# Patient Record
Sex: Female | Born: 1995 | Hispanic: Yes | Marital: Single | Smoking: Current every day smoker
Health system: Southern US, Community
[De-identification: ages and names within clinical notes are randomized; demographics above are authoritative.]

---
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
## 2017-05-17 ENCOUNTER — Encounter (HOSPITAL_COMMUNITY): Payer: Self-pay | Admitting: Emergency Medicine

## 2017-05-17 DIAGNOSIS — N9089 Other specified noninflammatory disorders of vulva and perineum: Secondary | ICD-10-CM | POA: Insufficient documentation

## 2017-05-17 NOTE — ED Triage Notes (Signed)
Patient recently out of jail, had intercourse and pain with intercourse. Adds "bump" -difficult to describe. +discharge,

## 2017-05-18 ENCOUNTER — Emergency Department (HOSPITAL_COMMUNITY)
Admission: EM | Admit: 2017-05-18 | Discharge: 2017-05-18 | Disposition: A | Discharge status: Home / Self Care | Payer: Self-pay | Attending: Emergency Medicine | Admitting: Emergency Medicine

## 2017-05-18 DIAGNOSIS — R3989 Other symptoms and signs involving the genitourinary system: Secondary | ICD-10-CM

## 2017-05-18 DIAGNOSIS — N760 Acute vaginitis: Secondary | ICD-10-CM

## 2017-05-18 DIAGNOSIS — B9689 Other specified bacterial agents as the cause of diseases classified elsewhere: Secondary | ICD-10-CM

## 2017-05-18 LAB — URINALYSIS, ROUTINE W REFLEX MICROSCOPIC
BILIRUBIN URINE: NEGATIVE
GLUCOSE, UA: NEGATIVE mg/dL
HGB URINE DIPSTICK: NEGATIVE
KETONES UR: 5 mg/dL — AB
Leukocytes, UA: NEGATIVE
Nitrite: NEGATIVE
PROTEIN: NEGATIVE mg/dL
Specific Gravity, Urine: 1.023 (ref 1.005–1.030)
pH: 7 (ref 5.0–8.0)

## 2017-05-18 LAB — WET PREP, GENITAL
Sperm: NONE SEEN
Trich, Wet Prep: NONE SEEN
Yeast Wet Prep HPF POC: NONE SEEN

## 2017-05-18 LAB — GC/CHLAMYDIA PROBE AMP (~~LOC~~) NOT AT ARMC
Chlamydia: NEGATIVE
Neisseria Gonorrhea: POSITIVE — AB

## 2017-05-18 LAB — PREGNANCY, URINE: PREG TEST UR: NEGATIVE

## 2017-05-18 MED ORDER — METRONIDAZOLE 500 MG PO TABS: 500.0000 mg | ORAL_TABLET | Freq: Two times a day (BID) | ORAL | 0 refills | Status: AC

## 2017-05-18 NOTE — ED Notes (Signed)
Pt states that she has been having pain to her lower abd/vagina x 2 months. States that she started hurting 2 months ago before going to jail. Says it got better while in jail, but recently got out and having sex again, and now having increased pain. Also c/o pain to her "outter vagina" (about 6 o'clock) with a slight smell.

## 2017-05-18 NOTE — ED Provider Notes (Signed)
[facility]-EMERGENCY DEPT Provider Note   CSN: [id number] Arrival date & time: 05/17/17  1714     History   Chief Complaint Chief Complaint  Patient presents with  . Vaginal Discharge    patient concerned about bumps around vaginal area, pain with pressure, +discharge with foul order.    HPI Christine Edwards is a 21 y.o. female.  Patient presents for evaluation of vaginal pain x 2 months. She noticed painful intercourse the first time 2 months ago that originated at the vaginal opening. No discharge or irregular bleeding. She was not sexually active for 2 months while in jail and then resumed activity recently after release and noticed the pain was still there. She reports two "spots" on the labia that appear abnormal to her. No fever, nausea, abdominal pain.   The history is provided by the patient. No language interpreter was used.    History reviewed. No pertinent past medical history.  Patient Active Problem List   Diagnosis Date Noted  . Chlamydia 08/14/2016  . TOA (tubo-ovarian abscess) 08/12/2016  . Gonorrhea 08/12/2016    History reviewed. No pertinent surgical history.  OB History    Gravida Para Term Preterm AB Living   0 0 0 0 0 0   SAB TAB Ectopic Multiple Live Births   0 0 0 0 0       Home Medications    Prior to Admission medications   Medication Sig Start Date End Date Taking? Authorizing Provider  doxycycline (VIBRA-TABS) 100 MG tablet Take 1 tablet (100 mg total) by mouth every 12 (twelve) hours. Patient not taking: Reported on 05/18/2017 08/14/16   Tereso Newcomer, MD  ibuprofen (ADVIL,MOTRIN) 800 MG tablet Take 1 tablet (800 mg total) by mouth 3 (three) times daily with meals as needed for mild pain or cramping. Patient not taking: Reported on 05/18/2017 08/14/16   Tereso Newcomer, MD  oxyCODONE-acetaminophen (PERCOCET/ROXICET) 5-325 MG tablet Take 1 tablet by mouth every 4 (four) hours as needed for moderate pain. Patient not taking: Reported on  05/18/2017 08/14/16   Tereso Newcomer, MD    Family History History reviewed. No pertinent family history.  Social History Social History  Substance Use Topics  . Smoking status: Current Every Day Smoker    Packs/day: 0.50    Types: Cigarettes  . Smokeless tobacco: Never Used  . Alcohol use Yes     Allergies   Food; Azithromycin; and Erythromycin   Review of Systems Review of Systems  Constitutional: Negative for chills and fever.  Gastrointestinal: Negative.  Negative for abdominal pain and nausea.  Genitourinary: Positive for genital sores. Negative for dysuria, vaginal bleeding and vaginal discharge.  Musculoskeletal: Negative.  Negative for back pain and myalgias.  Neurological: Negative.      Physical Exam Updated Vital Signs BP 105/71 (BP Location: Left Arm)   Pulse 66   Temp 98.3 F (36.8 C) (Oral)   Resp 16   Ht  (1.676 m)   Wt 61.2 kg (135 lb)   LMP 05/03/2017   SpO2 98%   BMI 21.79 kg/m   Physical Exam  Constitutional: She is oriented to person, place, and time. She appears well-developed and well-nourished.  Neck: Normal range of motion.  Pulmonary/Chest: Effort normal.  Genitourinary:     Genitourinary Comments: Small, nonraised, gray vulvar patches visualized. No vesicles, blisters, erythema. No CMT, adnexal mass or tenderness.   Neurological: She is alert and oriented to person, place, and time.  Skin: Skin is  warm and dry.     ED Treatments / Results  Labs (all labs ordered are listed, but only abnormal results are displayed) Labs Reviewed  WET PREP, GENITAL - Abnormal; Notable for the following:       Result Value   Clue Cells Wet Prep HPF POC PRESENT (*)    WBC, Wet Prep HPF POC MODERATE (*)    All other components within normal limits  URINALYSIS, ROUTINE W REFLEX MICROSCOPIC - Abnormal; Notable for the following:    Ketones, ur 5 (*)    All other components within normal limits  PREGNANCY, URINE  GC/CHLAMYDIA PROBE AMP  ([place]) NOT AT [facility]    EKG  EKG Interpretation None       Radiology No results found.  Procedures Procedures (including critical care time)  Medications Ordered in ED Medications - No data to display   Initial Impression / Assessment and Plan / ED Course  I have reviewed the triage vital signs and the nursing notes.  Pertinent labs & imaging results that were available during my care of the patient were reviewed by me and considered in my medical decision making (see chart for details).     Patient here with concern for vaginal sores that are painful x 2 months. She is found to have BV, thought incidental to sores. Consider vaginal wart vs cancerous lesion. Do not suspect herpes, chancre or abscess. Will refer to GYN. RPR and HIV offered but patient declined.   Final Clinical Impressions(s) / ED Diagnoses   Final diagnoses:  None   1. Genital sore 2. BV  New Prescriptions New Prescriptions   No medications on file     Elpidio Anis, Cordelia Poche 05/18/17 0354    Elpidio Anis, PA-C 05/18/17 1610    Zadie Rhine, MD 05/18/17 [phone]

## 2017-05-18 NOTE — Discharge Instructions (Signed)
Take Flagyl for incidental finding of bacterial vaginosis. Do not drink alcohol while taking this medication. Follow up with gynecology for further evaluation of persistent vaginal sores.

## 2018-02-24 ENCOUNTER — Inpatient Hospital Stay
Admit: 2018-02-24 | Discharge: 2018-02-24 | Disposition: A | Discharge status: Home / Self Care | Payer: Self-pay | Attending: Pediatric Emergency Medicine

## 2018-02-24 ENCOUNTER — Emergency Department: Admit: 2018-02-24 | Payer: Self-pay

## 2018-02-24 ENCOUNTER — Emergency Department: Payer: Self-pay

## 2018-02-24 DIAGNOSIS — N83201 Unspecified ovarian cyst, right side: Secondary | ICD-10-CM

## 2018-02-24 LAB — CBC WITH MANUAL DIFF
ABS. BASOPHILS: 0 10*3/uL (ref 0.0–0.1)
ABS. EOSINOPHILS: 0.2 10*3/uL (ref 0.0–0.4)
ABS. IMM. GRANS.: 0 10*3/uL
ABS. LYMPHOCYTES: 5 10*3/uL — ABNORMAL HIGH (ref 0.8–3.5)
ABS. MONOCYTES: 0.4 10*3/uL (ref 0.0–1.0)
ABS. NEUTROPHILS: 6.3 10*3/uL (ref 1.8–8.0)
ABSOLUTE NRBC: 0 10*3/uL (ref 0.00–0.01)
BAND NEUTROPHILS: 0 % (ref 0–6)
BASOPHILS: 0 % (ref 0–1)
BLASTS: 0 %
Band Neutrophils: 0 % (ref 0–6)
Basophils %: 0 % (ref 0–1)
Basophils Absolute: 0 10*3/uL (ref 0.0–0.1)
Blasts: 0 %
EOSINOPHILS: 2 % (ref 0–7)
Eosinophils %: 2 % (ref 0–7)
Eosinophils Absolute: 0.2 10*3/uL (ref 0.0–0.4)
Granulocyte Absolute Count: 0 10*3/uL
HCT: 41.2 % (ref 35.0–47.0)
HGB: 13.8 g/dL (ref 11.5–16.0)
Hematocrit: 41.2 % (ref 35.0–47.0)
Hemoglobin: 13.8 g/dL (ref 11.5–16.0)
IMMATURE GRANULOCYTES: 0 %
Immature Granulocytes: 0 %
LYMPHOCYTES: 42 % (ref 12–49)
Lymphocytes %: 42 % (ref 12–49)
Lymphocytes Absolute: 5 10*3/uL — ABNORMAL HIGH (ref 0.8–3.5)
MCH: 31.2 PG (ref 26.0–34.0)
MCH: 31.2 PG (ref 26.0–34.0)
MCHC: 33.5 g/dL (ref 30.0–36.5)
MCHC: 33.5 g/dL (ref 30.0–36.5)
MCV: 93.2 FL (ref 80.0–99.0)
MCV: 93.2 FL (ref 80.0–99.0)
METAMYELOCYTES: 0 %
MONOCYTES: 3 % — ABNORMAL LOW (ref 5–13)
MPV: 10.9 FL (ref 8.9–12.9)
MPV: 10.9 FL (ref 8.9–12.9)
MYELOCYTES: 0 %
Metamyelocytes Relative: 0 %
Monocytes %: 3 % — ABNORMAL LOW (ref 5–13)
Monocytes Absolute: 0.4 10*3/uL (ref 0.0–1.0)
Myelocyte Percent: 0 %
NEUTROPHILS: 53 % (ref 32–75)
NRBC Absolute: 0 10*3/uL (ref 0.00–0.01)
NRBC: 0 PER 100 WBC
Neutrophils %: 53 % (ref 32–75)
Neutrophils Absolute: 6.3 10*3/uL (ref 1.8–8.0)
Nucleated RBCs: 0 PER 100 WBC
OTHER CELL: 0
Other Cell: 0
PLATELET: 211 10*3/uL (ref 150–400)
PROMYELOCYTES: 0 %
Platelets: 211 10*3/uL (ref 150–400)
Promyelocytes: 0 %
RBC: 4.42 M/uL (ref 3.80–5.20)
RBC: 4.42 M/uL (ref 3.80–5.20)
RDW: 12.7 % (ref 11.5–14.5)
RDW: 12.7 % (ref 11.5–14.5)
WBC: 11.9 10*3/uL — ABNORMAL HIGH (ref 3.6–11.0)
WBC: 11.9 10*3/uL — ABNORMAL HIGH (ref 3.6–11.0)

## 2018-02-24 LAB — METABOLIC PANEL, COMPREHENSIVE
A-G Ratio: 1.2 (ref 1.1–2.2)
ALT (SGPT): 18 U/L (ref 12–78)
AST (SGOT): 12 U/L — ABNORMAL LOW (ref 15–37)
Albumin: 3.8 g/dL (ref 3.5–5.0)
Alk. phosphatase: 72 U/L (ref 45–117)
Anion gap: 6 mmol/L (ref 5–15)
BUN/Creatinine ratio: 17 (ref 12–20)
BUN: 15 MG/DL (ref 6–20)
Bilirubin, total: 0.2 MG/DL (ref 0.2–1.0)
CO2: 25 mmol/L (ref 21–32)
Calcium: 9 MG/DL (ref 8.5–10.1)
Chloride: 107 mmol/L (ref 97–108)
Creatinine: 0.88 MG/DL (ref 0.55–1.02)
GFR est AA: >60 mL/min/{1.73_m2} (ref >60)
GFR est non-AA: >60 mL/min/{1.73_m2} (ref >60)
Globulin: 3.3 g/dL (ref 2.0–4.0)
Glucose: 95 mg/dL (ref 65–100)
Potassium: 3.7 mmol/L (ref 3.5–5.1)
Protein, total: 7.1 g/dL (ref 6.4–8.2)
Sodium: 138 mmol/L (ref 136–145)

## 2018-02-24 LAB — KOH, OTHER SOURCES
KOH: NONE SEEN
KOH: NONE SEEN

## 2018-02-24 LAB — URINALYSIS W/MICROSCOPIC
Bilirubin: NEGATIVE
Blood: NEGATIVE
Glucose: NEGATIVE mg/dL
Ketone: NEGATIVE mg/dL
Leukocyte Esterase: NEGATIVE
Nitrites: NEGATIVE
Protein: NEGATIVE mg/dL
Specific gravity: >1.03 — ABNORMAL HIGH (ref 1.003–1.030)
Urobilinogen: 0.2 EU/dL (ref 0.2–1.0)
pH (UA): 5.5 (ref 5.0–8.0)

## 2018-02-24 LAB — SAMPLES BEING HELD

## 2018-02-24 LAB — LIPASE
Lipase: 146 U/L (ref 73–393)
Lipase: 146 U/L (ref 73–393)

## 2018-02-24 LAB — URINE CULTURE HOLD SAMPLE

## 2018-02-24 LAB — WET PREP
Wet Prep, Trich: NONE SEEN
Wet prep: NONE SEEN

## 2018-02-24 LAB — BETA HCG, QT
Beta HCG, QT: <1 m[IU]/mL (ref 0–6)
hCG Quant: <1 m[IU]/mL (ref 0–6)

## 2018-02-24 LAB — COMPREHENSIVE METABOLIC PANEL
ALT: 18 U/L (ref 12–78)
AST: 12 U/L — ABNORMAL LOW (ref 15–37)
Albumin/Globulin Ratio: 1.2 (ref 1.1–2.2)
Albumin: 3.8 g/dL (ref 3.5–5.0)
Alkaline Phosphatase: 72 U/L (ref 45–117)
Anion Gap: 6 mmol/L (ref 5–15)
BUN: 15 MG/DL (ref 6–20)
Bun/Cre Ratio: 17 (ref 12–20)
CO2: 25 mmol/L (ref 21–32)
Calcium: 9 MG/DL (ref 8.5–10.1)
Chloride: 107 mmol/L (ref 97–108)
Creatinine: 0.88 MG/DL (ref 0.55–1.02)
EGFR IF NonAfrican American: >60 mL/min/{1.73_m2} (ref >60)
GFR African American: >60 mL/min/{1.73_m2} (ref >60)
Globulin: 3.3 g/dL (ref 2.0–4.0)
Glucose: 95 mg/dL (ref 65–100)
Potassium: 3.7 mmol/L (ref 3.5–5.1)
Sodium: 138 mmol/L (ref 136–145)
Total Bilirubin: 0.2 MG/DL (ref 0.2–1.0)
Total Protein: 7.1 g/dL (ref 6.4–8.2)

## 2018-02-24 LAB — URINALYSIS WITH MICROSCOPIC
Bilirubin, Urine: NEGATIVE
Blood, Urine: NEGATIVE
Glucose, Ur: NEGATIVE mg/dL
Ketones, Urine: NEGATIVE mg/dL
Leukocyte Esterase, Urine: NEGATIVE
Nitrite, Urine: NEGATIVE
Protein, UA: NEGATIVE mg/dL
Specific Gravity, UA: >1.03 NA — ABNORMAL HIGH (ref 1.003–1.030)
Urobilinogen, UA, POCT: 0.2 EU/dL (ref 0.2–1.0)
pH, UA: 5.5 (ref 5.0–8.0)

## 2018-02-24 MED ORDER — KETOROLAC TROMETHAMINE 30 MG/ML INJECTION
30 mg/mL (1 mL) | INTRAMUSCULAR | Status: AC
Start: 2018-02-24 — End: 2018-02-24
  Administered 2018-02-24: 17:00:00 via INTRAVENOUS

## 2018-02-24 MED ORDER — ONDANSETRON 4 MG TAB, RAPID DISSOLVE
4 mg | ORAL_TABLET | Freq: Three times a day (TID) | ORAL | 0 refills | Status: AC | PRN
Start: 2018-02-24 — End: ?

## 2018-02-24 MED ORDER — SODIUM CHLORIDE 0.9% BOLUS IV
0.9 % | Freq: Once | INTRAVENOUS | Status: AC
Start: 2018-02-24 — End: 2018-02-24
  Administered 2018-02-24: 17:00:00 via INTRAVENOUS

## 2018-02-24 MED ORDER — OXYCODONE-ACETAMINOPHEN 5 MG-325 MG TAB
5-325 mg | ORAL_TABLET | ORAL | 0 refills | Status: AC | PRN
Start: 2018-02-24 — End: 2018-02-27

## 2018-02-24 MED ORDER — DOXYCYCLINE HYCLATE 100 MG TAB
100 mg | ORAL | Status: AC
Start: 2018-02-24 — End: 2018-02-24
  Administered 2018-02-24: 20:00:00 via ORAL

## 2018-02-24 MED ORDER — METRONIDAZOLE 500 MG TAB
500 mg | ORAL_TABLET | Freq: Two times a day (BID) | ORAL | 0 refills | Status: AC
Start: 2018-02-24 — End: 2018-03-03

## 2018-02-24 MED ORDER — ONDANSETRON (PF) 4 MG/2 ML INJECTION
4 mg/2 mL | INTRAMUSCULAR | Status: AC
Start: 2018-02-24 — End: 2018-02-24
  Administered 2018-02-24: 17:00:00 via INTRAVENOUS

## 2018-02-24 MED ORDER — OXYCODONE-ACETAMINOPHEN 5 MG-325 MG TAB
5-325 mg | ORAL | Status: AC
Start: 2018-02-24 — End: 2018-02-24
  Administered 2018-02-24: 20:00:00 via ORAL

## 2018-02-24 MED ORDER — LIDOCAINE (PF) 10 MG/ML (1 %) IJ SOLN
10 mg/mL (1 %) | Freq: Once | INTRAMUSCULAR | Status: AC
Start: 2018-02-24 — End: 2018-02-24
  Administered 2018-02-24: 20:00:00 via INTRAMUSCULAR

## 2018-02-24 MED ORDER — DOXYCYCLINE HYCLATE 100 MG TAB
100 mg | ORAL_TABLET | Freq: Two times a day (BID) | ORAL | 0 refills | Status: AC
Start: 2018-02-24 — End: 2018-03-03

## 2018-02-24 MED FILL — OXYCODONE-ACETAMINOPHEN 5 MG-325 MG TAB: 5-325 mg | ORAL | Qty: 2

## 2018-02-24 MED FILL — SODIUM CHLORIDE 0.9 % IV: INTRAVENOUS | Qty: 1000

## 2018-02-24 MED FILL — KETOROLAC TROMETHAMINE 30 MG/ML INJECTION: 30 mg/mL (1 mL) | INTRAMUSCULAR | Qty: 1

## 2018-02-24 MED FILL — ONDANSETRON (PF) 4 MG/2 ML INJECTION: 4 mg/2 mL | INTRAMUSCULAR | Qty: 2

## 2018-02-24 MED FILL — DOXYCYCLINE HYCLATE 100 MG TAB: 100 mg | ORAL | Qty: 1

## 2018-02-24 MED FILL — CEFTRIAXONE 250 MG SOLUTION FOR INJECTION: 250 mg | INTRAMUSCULAR | Qty: 250

## 2018-02-24 NOTE — ED Notes (Signed)
Pt discharged home with parent/guardian. Pt acting age appropriately, respirations  Regular and unlabored, cap refill less than two seconds. Parent/guardian verbalized understanding of discharge paperwork and has no further questions at this time.

## 2018-02-24 NOTE — ED Provider Notes (Signed)
ED Provider Notes by Barry Dienes, NP at 02/24/18 1331                Author: Barry Dienes, NP  Service: --  Author Type: Nurse Practitioner       Filed: 02/24/18 2226  Date of Service: 02/24/18 1331  Status: Attested           Editor: Barry Dienes, NP (Nurse Practitioner)  Cosigner: Presley Raddle, MD at 02/27/18 2153          Attestation signed by Presley Raddle, MD at 02/27/18 2153          I was personally available for consultation in the emergency department.  I have reviewed the chart and agree with the documentation recorded by the [facility], including  the assessment, treatment plan, and disposition.   Presley Raddle, MD                                       22 y/o female with acute onset abdominal pain for the past 1 hour. She called EMS for transport here. She has never  had pain like this in the past. She has not taken any medications and no treatments tried. She denies any fever; no vomiting but c/o nausea today. She has had diarrhea for the past couple days, about 3-4 times a day, loose and non-bloody; drinking fluids  well but decreased appetite, hasn't eaten yet today. She ate [employer] yesterday. No vaginal pain or discharge. No dysuria, hematuria, flank pain or urgency. She is sexually active, no forms of birth control. Denies smoking, + alcohol use and drug use.       Pmh: none   Social: vaccines utd; non-smoker                History reviewed. No pertinent past medical history.      History reviewed. No pertinent surgical history.        History reviewed. No pertinent family history.        Social History          Socioeconomic History         ?  Marital status:  Not on file              Spouse name:  Not on file         ?  Number of children:  Not on file     ?  Years of education:  Not on file     ?  Highest education level:  Not on file       Occupational History        ?  Not on file       Social Needs         ?  Financial resource strain:  Not on file        ?   Food insecurity:              Worry:  Not on file         Inability:  Not on file        ?  Transportation needs:              Medical:  Not on file         Non-medical:  Not on file       Tobacco Use         ?  Smoking status:  Current Some Day Smoker     ?  Smokeless tobacco:  Current User       Substance and Sexual Activity         ?  Alcohol use:  Not on file     ?  Drug use:  Not on file     ?  Sexual activity:  Not on file       Lifestyle        ?  Physical activity:              Days per week:  Not on file         Minutes per session:  Not on file         ?  Stress:  Not on file       Relationships        ?  Social connections:              Talks on phone:  Not on file         Gets together:  Not on file         Attends religious service:  Not on file         Active member of club or organization:  Not on file         Attends meetings of clubs or organizations:  Not on file         Relationship status:  Not on file        ?  Intimate partner violence:              Fear of current or ex partner:  Not on file         Emotionally abused:  Not on file         Physically abused:  Not on file         Forced sexual activity:  Not on file        Other Topics  Concern        ?  Not on file       Social History Narrative        ?  Not on file              ALLERGIES: Erythromycin      Review of Systems    Constitutional: Negative.  Negative for activity change, appetite change and fever.    HENT: Negative.  Negative for sore throat.     Respiratory: Negative.  Negative for cough and wheezing.     Cardiovascular: Negative.  Negative for chest pain.    Gastrointestinal: Positive for abdominal pain and nausea . Negative for diarrhea and vomiting.    Genitourinary: Negative.     Musculoskeletal: Negative.  Negative for back pain and neck pain.    Skin: Negative.  Negative for rash.    Neurological: Negative.  Negative for headaches.    All other systems reviewed and are negative.           Vitals:           02/24/18 1310   02/24/18 1312         BP:    126/67     Pulse:    60     Resp:    22     Temp:    97.6 ??F (36.4 ??C)     SpO2:    99%         Weight:  58.5 kg (128  lb 15.5 oz)                  Physical Exam    Constitutional: She is oriented to person, place, and time. She appears well-developed and well-nourished. No distress.   Patient moaning and crying, curled up in ball on bed     HENT:   Right Ear: External ear normal.   Left Ear: External ear normal.    Mouth/Throat: Oropharynx is clear and moist. No oropharyngeal exudate.    Eyes: Pupils are equal, round, and reactive to light.    Neck: Normal range of motion. Neck supple.    Cardiovascular: Normal rate, regular rhythm and normal heart sounds.    Pulmonary/Chest: Effort normal and breath sounds normal. No respiratory distress. She has no wheezes.    Abdominal: Soft. Bowel sounds are normal. She exhibits no distension. There is tenderness in the  right upper quadrant, right lower quadrant,  periumbilical area and suprapubic area. There is no rebound, no guarding and no  CVA tenderness.   Genitourinary: Vagina normal and uterus normal. Cervix exhibits no motion tenderness and no discharge. Right  adnexum displays tenderness. No tenderness in the vagina. No vaginal discharge found.   Musculoskeletal: Normal range of motion. She exhibits no edema or tenderness.   Lymphadenopathy:     She has no cervical adenopathy.   Neurological: She is alert and oriented to person, place, and time.    Skin: Skin is warm and dry.    Nursing note and vitals reviewed.          MDM   Number of Diagnoses or Management Options   BV (bacterial vaginosis):    Hemorrhagic cyst of right ovary:    Diagnosis management comments: 22 y/o female with acute onset abdominal pain, generalized to the entire right side; no flank pain, hematuria or fever;    Broad DDX includes: ovarian torsion, cyst, ectopic pregnancy, pid, pyelonephritis,       Plan-- cbc, cmp, lipase, ultrasound pelvis and limited rlq, ivf,  urine hcg, ua          Amount and/or Complexity of Data Reviewed   Clinical lab tests: ordered and reviewed   Tests in the radiology section of CPT??: ordered and reviewed      Risk of Complications, Morbidity, and/or Mortality   Presenting problems: moderate  Diagnostic procedures: moderate  Management options: moderate     Patient Progress   Patient progress: improved             Procedures                 Recent Results (from the past 24 hour(s))     BETA HCG, QT          Collection Time: 02/24/18  1:20 PM         Result  Value  Ref Range            Beta HCG, QT  <1  0 - 6 MIU/ML       CBC WITH MANUAL DIFF          Collection Time: 02/24/18  1:20 PM         Result  Value  Ref Range            WBC  11.9 (H)  3.6 - 11.0 K/uL       RBC  4.42  3.80 - 5.20 M/uL       HGB  13.8  11.5 -  16.0 g/dL       HCT  41.2  35.0 - 47.0 %       MCV  93.2  80.0 - 99.0 FL       MCH  31.2  26.0 - 34.0 PG       MCHC  33.5  30.0 - 36.5 g/dL       RDW  12.7  11.5 - 14.5 %       PLATELET  211  150 - 400 K/uL       MPV  10.9  8.9 - 12.9 FL       NRBC  0.0  0 PER 100 WBC       ABSOLUTE NRBC  0.00  0.00 - 0.01 K/uL       NEUTROPHILS  53  32 - 75 %       BAND NEUTROPHILS  0  0 - 6 %       LYMPHOCYTES  42  12 - 49 %       MONOCYTES  3 (L)  5 - 13 %       EOSINOPHILS  2  0 - 7 %       BASOPHILS  0  0 - 1 %       METAMYELOCYTES  0  0 %       MYELOCYTES  0  0 %       PROMYELOCYTES  0  0 %       BLASTS  0  0 %       OTHER CELL  0  0         IMMATURE GRANULOCYTES  0  %       ABS. NEUTROPHILS  6.3  1.8 - 8.0 K/UL       ABS. LYMPHOCYTES  5.0 (H)  0.8 - 3.5 K/UL       ABS. MONOCYTES  0.4  0.0 - 1.0 K/UL       ABS. EOSINOPHILS  0.2  0.0 - 0.4 K/UL       ABS. BASOPHILS  0.0  0.0 - 0.1 K/UL       ABS. IMM. GRANS.  0.0  K/UL       DF  MANUAL          RBC COMMENTS  NORMOCYTIC, NORMOCHROMIC          WBC COMMENTS  ATYPICAL LYMPHOCYTES PRESENT          METABOLIC PANEL, COMPREHENSIVE          Collection Time: 02/24/18  1:20 PM         Result  Value  Ref Range             Sodium  138  136 - 145 mmol/L       Potassium  3.7  3.5 - 5.1 mmol/L       Chloride  107  97 - 108 mmol/L       CO2  25  21 - 32 mmol/L       Anion gap  6  5 - 15 mmol/L       Glucose  95  65 - 100 mg/dL       BUN  15  6 - 20 MG/DL       Creatinine  0.88  0.55 - 1.02 MG/DL       BUN/Creatinine ratio  17  12 - 20         GFR est  AA  >60  >60 ml/min/1.53m       GFR est non-AA  >60  >60 ml/min/1.719m      Calcium  9.0  8.5 - 10.1 MG/DL       Bilirubin, total  0.2  0.2 - 1.0 MG/DL       ALT (SGPT)  18  12 - 78 U/L       AST (SGOT)  12 (L)  15 - 37 U/L       Alk. phosphatase  72  45 - 117 U/L       Protein, total  7.1  6.4 - 8.2 g/dL       Albumin  3.8  3.5 - 5.0 g/dL       Globulin  3.3  2.0 - 4.0 g/dL       A-G Ratio  1.2  1.1 - 2.2         LIPASE          Collection Time: 02/24/18  1:20 PM         Result  Value  Ref Range            Lipase  146  73 - 393 U/L       SAMPLES BEING HELD          Collection Time: 02/24/18  1:20 PM         Result  Value  Ref Range            SAMPLES BEING HELD  1RED 1BC(SILV)         COMMENT                  Add-on orders for these samples will be processed based on acceptable specimen integrity and analyte stability, which may vary by analyte.       URINALYSIS W/MICROSCOPIC          Collection Time: 02/24/18  2:28 PM         Result  Value  Ref Range            Color  YELLOW/STRAW          Appearance  CLEAR  CLEAR         Specific gravity  >1.030 (H)  1.003 - 1.030       pH (UA)  5.5  5.0 - 8.0         Protein  NEGATIVE   NEG mg/dL       Glucose  NEGATIVE   NEG mg/dL       Ketone  NEGATIVE   NEG mg/dL       Bilirubin  NEGATIVE   NEG         Blood  NEGATIVE   NEG         Urobilinogen  0.2  0.2 - 1.0 EU/dL       Nitrites  NEGATIVE   NEG         Leukocyte Esterase  NEGATIVE   NEG         WBC  0-4  0 - 4 /hpf       RBC  0-5  0 - 5 /hpf       Epithelial cells  FEW  FEW /lpf       Bacteria  2+ (A)  NEG /hpf       Hyaline cast  0-2  0 - 5 /lpf       URINE CULTURE HOLD SAMPLE  Collection Time: 02/24/18  2:28 PM         Result  Value  Ref Range            Urine culture hold                  URINE ON HOLD IN MICROBIOLOGY DEPT FOR 3 DAYS. IF UNPRESERVED URINE IS SUBMITTED, IT CANNOT BE USED FOR ADDITIONAL TESTING AFTER 24 HRS, RECOLLECTION  WILL BE REQUIRED.       WET PREP          Collection Time: 02/24/18  3:44 PM         Result  Value  Ref Range            Clue cells  CLUE CELLS PRESENT               Wet prep  NO TRICHOMONAS SEEN              US Transvaginal      Result Date: 02/24/2018   EXAM:  ULTRASOUND FEMALE PELVIS TRANSABDOMINAL AND TRANSVAGINAL INDICATION: Abdominal and right lower quadrant pelvic pain. COMPARISON: None. TRANSABDOMINAL TECHNIQUE: Real-time sonography of the pelvis was performed using the urine filled bladder as  an acoustic window. Multiple static images of the uterus and ovaries were obtained. FINDINGS: UTERUS: The uterus is normal in size and echotexture and measures 8.2 x 4.4 x 5.2 cm. ENDOMETRIUM: The endometrial stripe measures 10 mm. RIGHT OVARY: The right  ovary measures 6.7 x 5.5 x 4.9 cm and contains a complex cystic 2.6 x 2.2 x 3.5 cm cyst. There is positive flow in the right ovary. LEFT OVARY: The left ovary is not visualized secondary to position CUL-DE-SAC: There is no mass or fluid or other abnormality  in the adnexa or cul-de-sac.       IMPRESSION:  Complex right ovarian cyst. Nonvisualization of the left ovary. Transvaginal sonography will be performed to better evaluate. TRANSVAGINAL TECHNIQUE: Transvaginal sonography was performed with multiple static images of the uterus and ovaries  obtained. FINDINGS: UTERUS: The uterus is normal. The uterus measures 7.9 x 3.7 x 5.3 cm.. ENDOMETRIUM: The endometrial stripe measures 7 mm.Marland Kitchen RIGHT OVARY: The right ovary is normal. The right ovary measures 8.7 x 4.8 x 4.7 cm and contains a complex cystic  3.7 x 2.3 x 2.4 cm cyst. cm.  There is normal color flow to the right ovary. LEFT OVARY: The left ovary is  normal. The left ovary measures 3.9 x 2.7 x 3.2 cm. There is normal color flow to the left ovary. CUL-DE-SAC: There is no fluid or mass or other  abnormality in the pelvic cul-de-sac. IMPRESSION: Complex right ovarian cyst, possibly hemorrhagic cyst. Normal uterus and left ovary.       [place] Pelv Non Obs      Result Date: 02/24/2018   EXAM:  ULTRASOUND FEMALE PELVIS TRANSABDOMINAL AND TRANSVAGINAL INDICATION: Abdominal and right lower quadrant pelvic pain. COMPARISON: None. TRANSABDOMINAL TECHNIQUE: Real-time sonography of the pelvis was performed using the urine filled bladder as  an acoustic window. Multiple static images of the uterus and ovaries were obtained. FINDINGS: UTERUS: The uterus is normal in size and echotexture and measures 8.2 x 4.4 x 5.2 cm. ENDOMETRIUM: The endometrial stripe measures 10 mm. RIGHT OVARY: The right  ovary measures 6.7 x 5.5 x 4.9 cm and contains a complex cystic 2.6 x 2.2 x 3.5 cm cyst. There is positive flow in the right ovary. LEFT OVARY: The left  ovary is not visualized secondary to position CUL-DE-SAC: There is no mass or fluid or other abnormality  in the adnexa or cul-de-sac.       IMPRESSION:  Complex right ovarian cyst. Nonvisualization of the left ovary. Transvaginal sonography will be performed to better evaluate. TRANSVAGINAL TECHNIQUE: Transvaginal sonography was performed with multiple static images of the uterus and ovaries  obtained. FINDINGS: UTERUS: The uterus is normal. The uterus measures 7.9 x 3.7 x 5.3 cm.. ENDOMETRIUM: The endometrial stripe measures 7 mm.Marland Kitchen RIGHT OVARY: The right ovary is normal. The right ovary measures 8.7 x 4.8 x 4.7 cm and contains a complex cystic  3.7 x 2.3 x 2.4 cm cyst. cm.  There is normal color flow to the right ovary. LEFT OVARY: The left ovary is normal. The left ovary measures 3.9 x 2.7 x 3.2 cm. There is normal color flow to the left ovary. CUL-DE-SAC: There is no fluid or mass or other  abnormality in the pelvic cul-de-sac.  IMPRESSION: Complex right ovarian cyst, possibly hemorrhagic cyst. Normal uterus and left ovary.                 All results reviewed with patient; She would like to be prophylactic ally treated for STDs. She was given rocephin here and due to erythromycin unknown allergy she will be given doxycyline for 7 days. She was also + for BV so will be given Rx for flagyl  for 7 days as well. She felt better after toradol but with increased pain after getting up and moving around so percocet given and will give prescription for home. She is visiting from [facility] and due to return this weekend, so I advised her she will need  to find a gynecologist where she is from for follow up and bring report of ultrasound with her. Return precautions discussed.       Patient's results have been reviewed with them. Patient and /or family have verbally conveyed understanding and agreement of the patient's signs, symptoms, diagnosis, treatment and  prognosis and additionally agree to follow up as recommended or return to the Emergency Department should their condition change prior to follow-up. Discharge instructions have also been provided to the patient with some educational information regarding  their diagnosis as well as a list of reasons why they would want to return to the ER prior to their follow-up appointment should their condition change.

## 2018-02-24 NOTE — ED Notes (Signed)
EDUCATION: Patient education given on obgyn follow up and the patient expresses understanding and acceptance of instructions. Ambrose MantleEliza F Petrohovich, RN 02/24/2018 4:46 PM

## 2018-02-24 NOTE — ED Notes (Signed)
EDUCATION: Patient education given on obgyn follow up and the patient expresses understanding and acceptance of instructions. Eliza F Petrohovich, RN 02/24/2018 4:46 PM

## 2018-02-24 NOTE — ED Provider Notes (Signed)
22 y/o female with acute onset abdominal pain for the past 1 hour. She called EMS for transport here. She has never had pain like this in the past. She has not taken any medications and no treatments tried. She denies any fever; no vomiting but c/o nausea today. She has had diarrhea for the past couple days, about 3-4 times a day, loose and non-bloody; drinking fluids well but decreased appetite, hasn't eaten yet today. She ate [employer] yesterday. No vaginal pain or discharge. No dysuria, hematuria, flank pain or urgency. She is sexually active, no forms of birth control. Denies smoking, + alcohol use and drug use.     Pmh: none  Social: vaccines utd; non-smoker           History reviewed. No pertinent past medical history.    History reviewed. No pertinent surgical history.      History reviewed. No pertinent family history.    Social History     Socioeconomic History   ??? Marital status: Not on file     Spouse name: Not on file   ??? Number of children: Not on file   ??? Years of education: Not on file   ??? Highest education level: Not on file   Occupational History   ??? Not on file   Social Needs   ??? Financial resource strain: Not on file   ??? Food insecurity:     Worry: Not on file     Inability: Not on file   ??? Transportation needs:     Medical: Not on file     Non-medical: Not on file   Tobacco Use   ??? Smoking status: Current Some Day Smoker   ??? Smokeless tobacco: Current User   Substance and Sexual Activity   ??? Alcohol use: Not on file   ??? Drug use: Not on file   ??? Sexual activity: Not on file   Lifestyle   ??? Physical activity:     Days per week: Not on file     Minutes per session: Not on file   ??? Stress: Not on file   Relationships   ??? Social connections:     Talks on phone: Not on file     Gets together: Not on file     Attends religious service: Not on file     Active member of club or organization: Not on file     Attends meetings of clubs or organizations: Not on file     Relationship status: Not on file    ??? Intimate partner violence:     Fear of current or ex partner: Not on file     Emotionally abused: Not on file     Physically abused: Not on file     Forced sexual activity: Not on file   Other Topics Concern   ??? Not on file   Social History Narrative   ??? Not on file         ALLERGIES: Erythromycin    Review of Systems   Constitutional: Negative.  Negative for activity change, appetite change and fever.   HENT: Negative.  Negative for sore throat.    Respiratory: Negative.  Negative for cough and wheezing.    Cardiovascular: Negative.  Negative for chest pain.   Gastrointestinal: Positive for abdominal pain and nausea. Negative for diarrhea and vomiting.   Genitourinary: Negative.    Musculoskeletal: Negative.  Negative for back pain and neck pain.   Skin: Negative.  Negative for rash.   Neurological: Negative.  Negative for headaches.   All other systems reviewed and are negative.      Vitals:    02/24/18 1310 02/24/18 1312   BP:  126/67   Pulse:  60   Resp:  22   Temp:  97.6 ??F (36.4 ??C)   SpO2:  99%   Weight: 58.5 kg (128 lb 15.5 oz)             Physical Exam   Constitutional: She is oriented to person, place, and time. She appears well-developed and well-nourished. No distress.   Patient moaning and crying, curled up in ball on bed   HENT:   Right Ear: External ear normal.   Left Ear: External ear normal.   Mouth/Throat: Oropharynx is clear and moist. No oropharyngeal exudate.   Eyes: Pupils are equal, round, and reactive to light.   Neck: Normal range of motion. Neck supple.   Cardiovascular: Normal rate, regular rhythm and normal heart sounds.   Pulmonary/Chest: Effort normal and breath sounds normal. No respiratory distress. She has no wheezes.   Abdominal: Soft. Bowel sounds are normal. She exhibits no distension. There is tenderness in the right upper quadrant, right lower quadrant, periumbilical area and suprapubic area. There is no rebound, no guarding and no CVA tenderness.    Genitourinary: Vagina normal and uterus normal. Cervix exhibits no motion tenderness and no discharge. Right adnexum displays tenderness. No tenderness in the vagina. No vaginal discharge found.   Musculoskeletal: Normal range of motion. She exhibits no edema or tenderness.   Lymphadenopathy:     She has no cervical adenopathy.   Neurological: She is alert and oriented to person, place, and time.   Skin: Skin is warm and dry.   Nursing note and vitals reviewed.       MDM  Number of Diagnoses or Management Options  BV (bacterial vaginosis):   Hemorrhagic cyst of right ovary:   Diagnosis management comments: 22 y/o female with acute onset abdominal pain, generalized to the entire right side; no flank pain, hematuria or fever;   Broad DDX includes: ovarian torsion, cyst, ectopic pregnancy, pid, pyelonephritis,     Plan-- cbc, cmp, lipase, ultrasound pelvis and limited rlq, ivf, urine hcg, ua       Amount and/or Complexity of Data Reviewed  Clinical lab tests: ordered and reviewed  Tests in the radiology section of CPT??: ordered and reviewed    Risk of Complications, Morbidity, and/or Mortality  Presenting problems: moderate  Diagnostic procedures: moderate  Management options: moderate    Patient Progress  Patient progress: improved         Procedures          Recent Results (from the past 24 hour(s))   BETA HCG, QT    Collection Time: 02/24/18  1:20 PM   Result Value Ref Range    Beta HCG, QT <1 0 - 6 MIU/ML   CBC WITH MANUAL DIFF    Collection Time: 02/24/18  1:20 PM   Result Value Ref Range    WBC 11.9 (H) 3.6 - 11.0 K/uL    RBC 4.42 3.80 - 5.20 M/uL    HGB 13.8 11.5 - 16.0 g/dL    HCT 41.2 35.0 - 47.0 %    MCV 93.2 80.0 - 99.0 FL    MCH 31.2 26.0 - 34.0 PG    MCHC 33.5 30.0 - 36.5 g/dL    RDW 12.7 11.5 - 14.5 %  PLATELET 211 150 - 400 K/uL    MPV 10.9 8.9 - 12.9 FL    NRBC 0.0 0 PER 100 WBC    ABSOLUTE NRBC 0.00 0.00 - 0.01 K/uL    NEUTROPHILS 53 32 - 75 %    BAND NEUTROPHILS 0 0 - 6 %     LYMPHOCYTES 42 12 - 49 %    MONOCYTES 3 (L) 5 - 13 %    EOSINOPHILS 2 0 - 7 %    BASOPHILS 0 0 - 1 %    METAMYELOCYTES 0 0 %    MYELOCYTES 0 0 %    PROMYELOCYTES 0 0 %    BLASTS 0 0 %    OTHER CELL 0 0      IMMATURE GRANULOCYTES 0 %    ABS. NEUTROPHILS 6.3 1.8 - 8.0 K/UL    ABS. LYMPHOCYTES 5.0 (H) 0.8 - 3.5 K/UL    ABS. MONOCYTES 0.4 0.0 - 1.0 K/UL    ABS. EOSINOPHILS 0.2 0.0 - 0.4 K/UL    ABS. BASOPHILS 0.0 0.0 - 0.1 K/UL    ABS. IMM. GRANS. 0.0 K/UL    DF MANUAL      RBC COMMENTS NORMOCYTIC, NORMOCHROMIC      WBC COMMENTS ATYPICAL LYMPHOCYTES PRESENT     METABOLIC PANEL, COMPREHENSIVE    Collection Time: 02/24/18  1:20 PM   Result Value Ref Range    Sodium 138 136 - 145 mmol/L    Potassium 3.7 3.5 - 5.1 mmol/L    Chloride 107 97 - 108 mmol/L    CO2 25 21 - 32 mmol/L    Anion gap 6 5 - 15 mmol/L    Glucose 95 65 - 100 mg/dL    BUN 15 6 - 20 MG/DL    Creatinine 0.88 0.55 - 1.02 MG/DL    BUN/Creatinine ratio 17 12 - 20      GFR est AA >60 >60 ml/min/1.105m    GFR est non-AA >60 >60 ml/min/1.764m   Calcium 9.0 8.5 - 10.1 MG/DL    Bilirubin, total 0.2 0.2 - 1.0 MG/DL    ALT (SGPT) 18 12 - 78 U/L    AST (SGOT) 12 (L) 15 - 37 U/L    Alk. phosphatase 72 45 - 117 U/L    Protein, total 7.1 6.4 - 8.2 g/dL    Albumin 3.8 3.5 - 5.0 g/dL    Globulin 3.3 2.0 - 4.0 g/dL    A-G Ratio 1.2 1.1 - 2.2     LIPASE    Collection Time: 02/24/18  1:20 PM   Result Value Ref Range    Lipase 146 73 - 393 U/L   SAMPLES BEING HELD    Collection Time: 02/24/18  1:20 PM   Result Value Ref Range    SAMPLES BEING HELD 1RED 1BC(SILV)     COMMENT        Add-on orders for these samples will be processed based on acceptable specimen integrity and analyte stability, which may vary by analyte.   URINALYSIS W/MICROSCOPIC    Collection Time: 02/24/18  2:28 PM   Result Value Ref Range    Color YELLOW/STRAW      Appearance CLEAR CLEAR      Specific gravity >1.030 (H) 1.003 - 1.030    pH (UA) 5.5 5.0 - 8.0      Protein NEGATIVE  NEG mg/dL     Glucose NEGATIVE  NEG mg/dL    Ketone NEGATIVE  NEG mg/dL  Bilirubin NEGATIVE  NEG      Blood NEGATIVE  NEG      Urobilinogen 0.2 0.2 - 1.0 EU/dL    Nitrites NEGATIVE  NEG      Leukocyte Esterase NEGATIVE  NEG      WBC 0-4 0 - 4 /hpf    RBC 0-5 0 - 5 /hpf    Epithelial cells FEW FEW /lpf    Bacteria 2+ (A) NEG /hpf    Hyaline cast 0-2 0 - 5 /lpf   URINE CULTURE HOLD SAMPLE    Collection Time: 02/24/18  2:28 PM   Result Value Ref Range    Urine culture hold        URINE ON HOLD IN MICROBIOLOGY DEPT FOR 3 DAYS. IF UNPRESERVED URINE IS SUBMITTED, IT CANNOT BE USED FOR ADDITIONAL TESTING AFTER 24 HRS, RECOLLECTION WILL BE REQUIRED.   WET PREP    Collection Time: 02/24/18  3:44 PM   Result Value Ref Range    Clue cells CLUE CELLS PRESENT      Wet prep NO TRICHOMONAS SEEN         US Transvaginal    Result Date: 02/24/2018  EXAM:  ULTRASOUND FEMALE PELVIS TRANSABDOMINAL AND TRANSVAGINAL INDICATION: Abdominal and right lower quadrant pelvic pain. COMPARISON: None. TRANSABDOMINAL TECHNIQUE: Real-time sonography of the pelvis was performed using the urine filled bladder as an acoustic window. Multiple static images of the uterus and ovaries were obtained. FINDINGS: UTERUS: The uterus is normal in size and echotexture and measures 8.2 x 4.4 x 5.2 cm. ENDOMETRIUM: The endometrial stripe measures 10 mm. RIGHT OVARY: The right ovary measures 6.7 x 5.5 x 4.9 cm and contains a complex cystic 2.6 x 2.2 x 3.5 cm cyst. There is positive flow in the right ovary. LEFT OVARY: The left ovary is not visualized secondary to position CUL-DE-SAC: There is no mass or fluid or other abnormality in the adnexa or cul-de-sac.     IMPRESSION:  Complex right ovarian cyst. Nonvisualization of the left ovary. Transvaginal sonography will be performed to better evaluate. TRANSVAGINAL TECHNIQUE: Transvaginal sonography was performed with multiple static images of the uterus and ovaries obtained. FINDINGS:  UTERUS: The uterus is normal. The uterus measures 7.9 x 3.7 x 5.3 cm.. ENDOMETRIUM: The endometrial stripe measures 7 mm.Marland Kitchen RIGHT OVARY: The right ovary is normal. The right ovary measures 8.7 x 4.8 x 4.7 cm and contains a complex cystic 3.7 x 2.3 x 2.4 cm cyst. cm.  There is normal color flow to the right ovary. LEFT OVARY: The left ovary is normal. The left ovary measures 3.9 x 2.7 x 3.2 cm. There is normal color flow to the left ovary. CUL-DE-SAC: There is no fluid or mass or other abnormality in the pelvic cul-de-sac. IMPRESSION: Complex right ovarian cyst, possibly hemorrhagic cyst. Normal uterus and left ovary.     [place] Pelv Non Obs    Result Date: 02/24/2018  EXAM:  ULTRASOUND FEMALE PELVIS TRANSABDOMINAL AND TRANSVAGINAL INDICATION: Abdominal and right lower quadrant pelvic pain. COMPARISON: None. TRANSABDOMINAL TECHNIQUE: Real-time sonography of the pelvis was performed using the urine filled bladder as an acoustic window. Multiple static images of the uterus and ovaries were obtained. FINDINGS: UTERUS: The uterus is normal in size and echotexture and measures 8.2 x 4.4 x 5.2 cm. ENDOMETRIUM: The endometrial stripe measures 10 mm. RIGHT OVARY: The right ovary measures 6.7 x 5.5 x 4.9 cm and contains a complex cystic 2.6 x 2.2 x 3.5 cm cyst. There is positive  flow in the right ovary. LEFT OVARY: The left ovary is not visualized secondary to position CUL-DE-SAC: There is no mass or fluid or other abnormality in the adnexa or cul-de-sac.     IMPRESSION:  Complex right ovarian cyst. Nonvisualization of the left ovary. Transvaginal sonography will be performed to better evaluate. TRANSVAGINAL TECHNIQUE: Transvaginal sonography was performed with multiple static images of the uterus and ovaries obtained. FINDINGS: UTERUS: The uterus is normal. The uterus measures 7.9 x 3.7 x 5.3 cm.. ENDOMETRIUM: The endometrial stripe measures 7 mm.Marland Kitchen RIGHT OVARY: The right  ovary is normal. The right ovary measures 8.7 x 4.8 x 4.7 cm and contains a complex cystic 3.7 x 2.3 x 2.4 cm cyst. cm.  There is normal color flow to the right ovary. LEFT OVARY: The left ovary is normal. The left ovary measures 3.9 x 2.7 x 3.2 cm. There is normal color flow to the left ovary. CUL-DE-SAC: There is no fluid or mass or other abnormality in the pelvic cul-de-sac. IMPRESSION: Complex right ovarian cyst, possibly hemorrhagic cyst. Normal uterus and left ovary.            All results reviewed with patient; She would like to be prophylactic ally treated for STDs. She was given rocephin here and due to erythromycin unknown allergy she will be given doxycyline for 7 days. She was also + for BV so will be given Rx for flagyl for 7 days as well. She felt better after toradol but with increased pain after getting up and moving around so percocet given and will give prescription for home. She is visiting from [facility] and due to return this weekend, so I advised her she will need to find a gynecologist where she is from for follow up and bring report of ultrasound with her. Return precautions discussed.     Patient's results have been reviewed with them. Patient and /or family have verbally conveyed understanding and agreement of the patient's signs, symptoms, diagnosis, treatment and prognosis and additionally agree to follow up as recommended or return to the Emergency Department should their condition change prior to follow-up. Discharge instructions have also been provided to the patient with some educational information regarding their diagnosis as well as a list of reasons why they would want to return to the ER prior to their follow-up appointment should their condition change.

## 2018-02-27 LAB — CHLAMYDIA/GC PCR
CHLAmydia Amplified: NEGATIVE
Chlamydia amplified: NEGATIVE
N gonorrhoeae Amp.: NEGATIVE
N. gonorrhea, amplified: NEGATIVE

## 2018-03-17 IMAGING — CT CT ABD-PELV W/ CM
2 of 5 series · 15 of 46 positions shown, 17 images · IV contrast (APPLIED)
Comparison: None.

CLINICAL DATA: Lower abdominal pain for 2 weeks.  Nausea.

EXAM:
CT ABDOMEN AND PELVIS WITH CONTRAST
TECHNIQUE: Multidetector CT imaging of the abdomen and pelvis was performed
using the standard protocol following bolus administration of
intravenous contrast.
CONTRAST:  100mL REXS1S-LGG IOPAMIDOL (REXS1S-LGG) INJECTION 61%

[Series 2: axial st · axial · 0.81mm/px · z∈[-516,-106]mm · 12 of 94 slices shown, 14 images]
[im 6/94  soft-tissue]
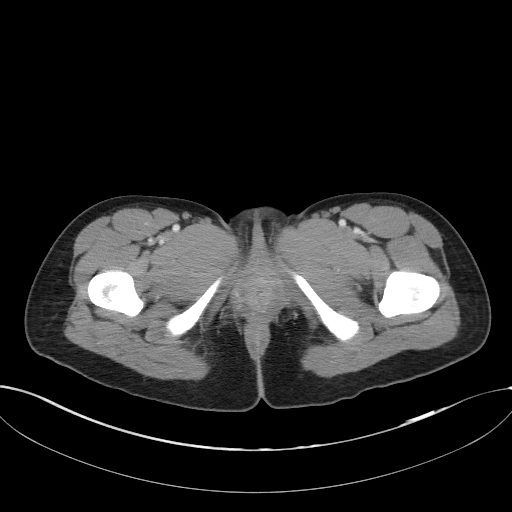
[im 6/94  bone]
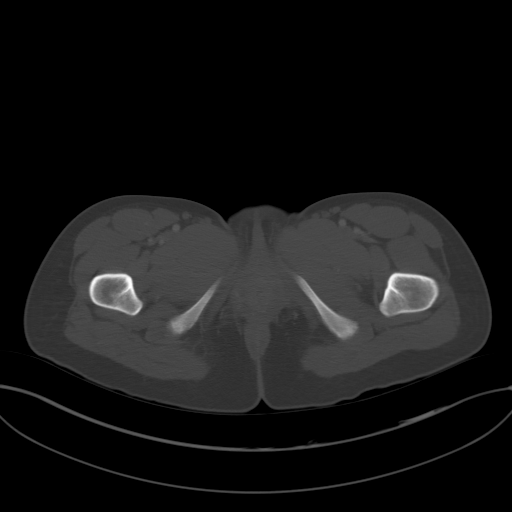
[im 17/94  soft-tissue]
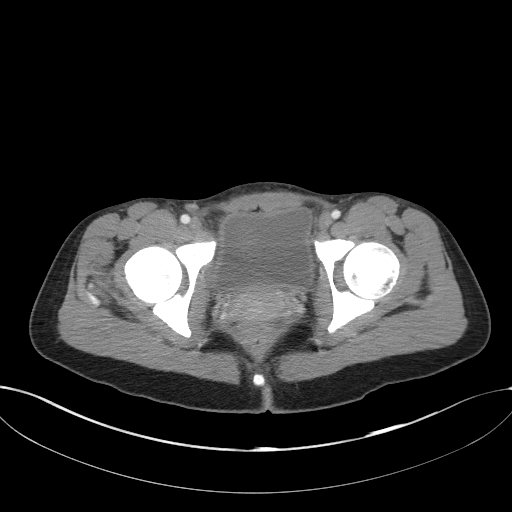
[im 22/94  soft-tissue]
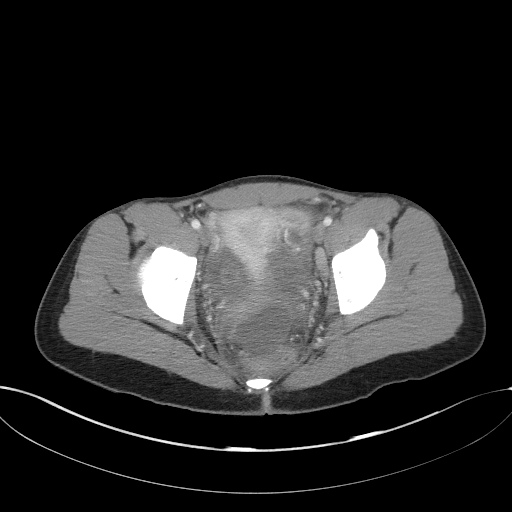
[im 28/94  soft-tissue]
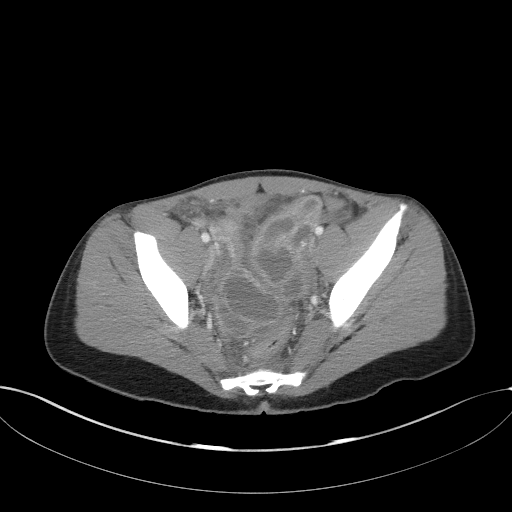
[im 39/94  soft-tissue]
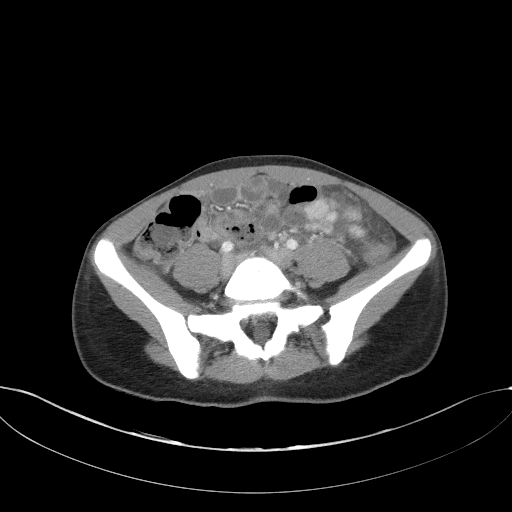
[im 44/94  soft-tissue]
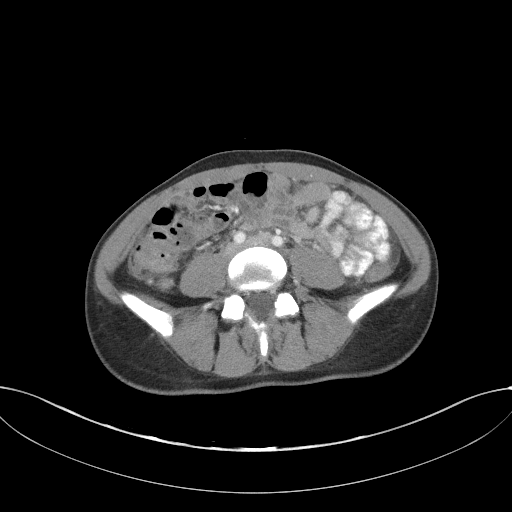
[im 50/94  soft-tissue]
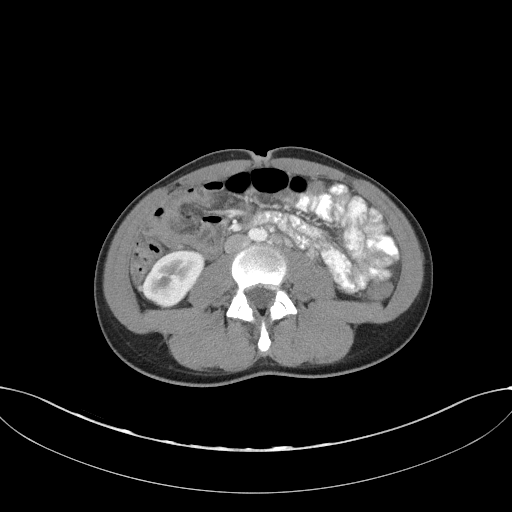
[im 61/94  soft-tissue]
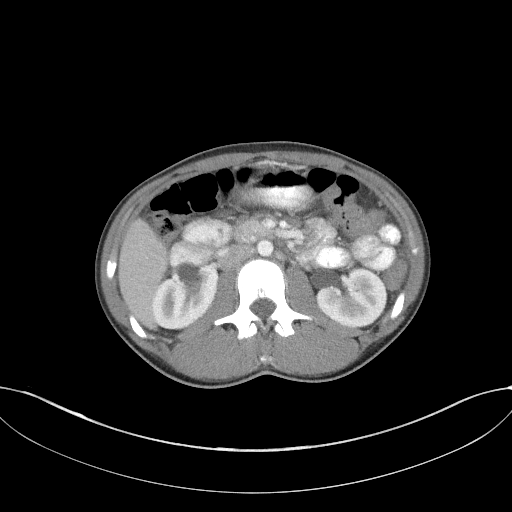
[im 66/94  soft-tissue]
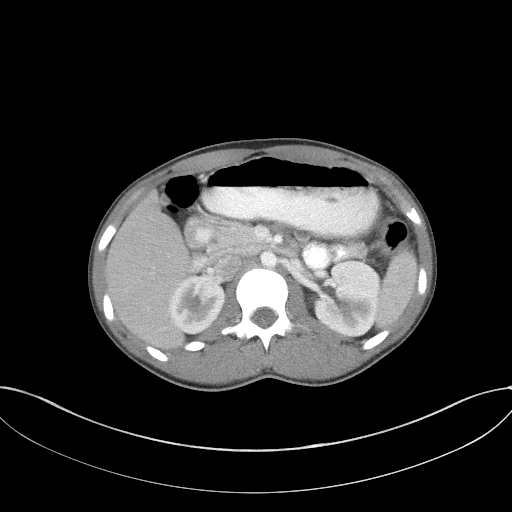
[im 66/94  bone]
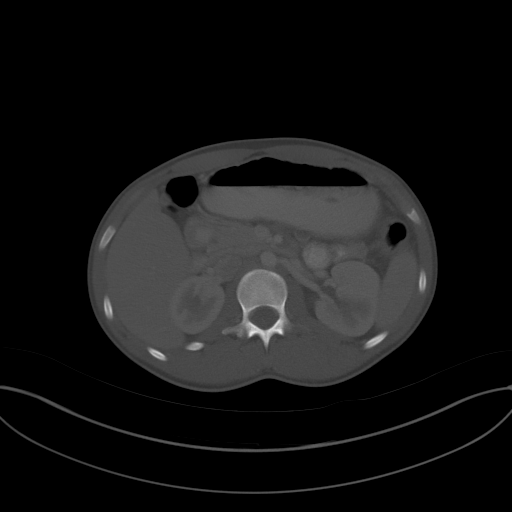
[im 72/94  soft-tissue]
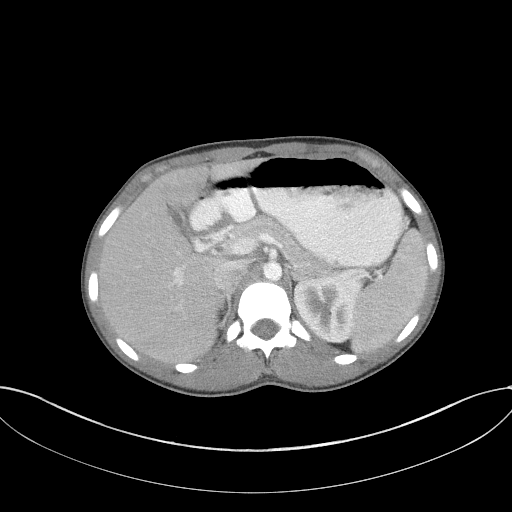
[im 83/94  soft-tissue]
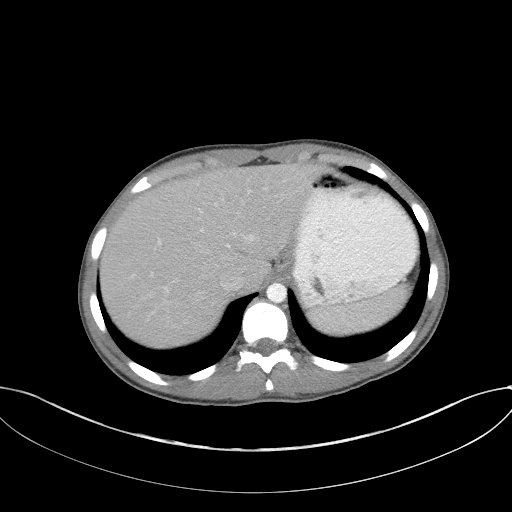
[im 88/94  soft-tissue]
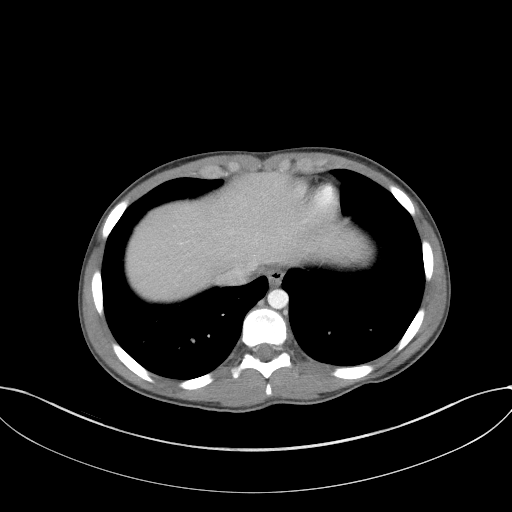

[Series 5: coronal st · coronal · 0.75mm/px · 3 of 75 slices shown]
[im 25/75  soft-tissue]
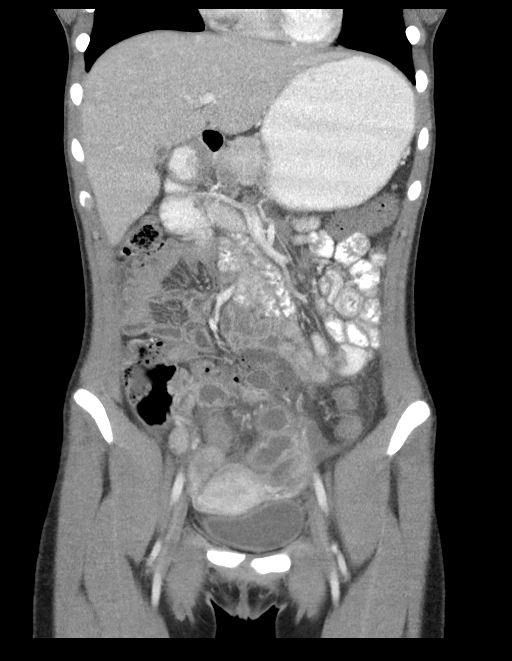
[im 33/75  soft-tissue]
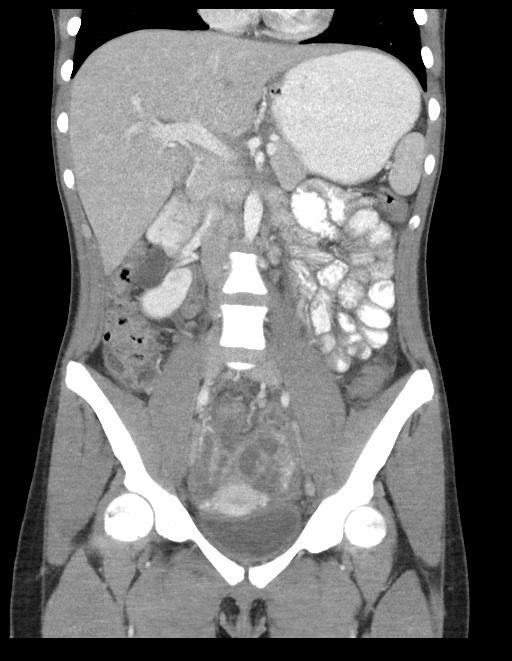
[im 42/75  soft-tissue]
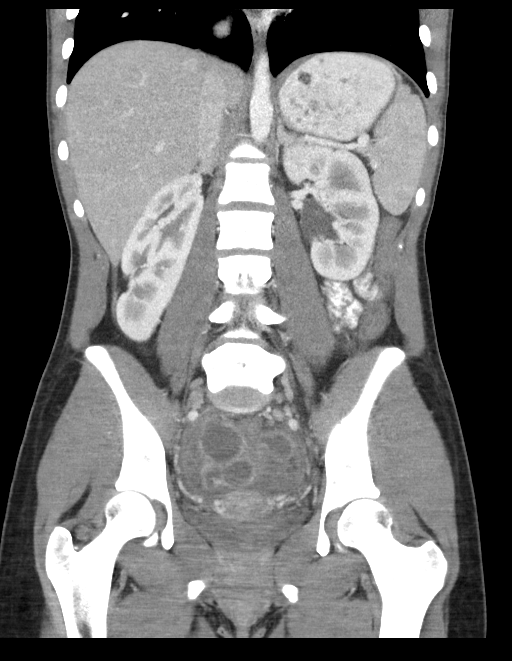

[15 of 46 positions shown; findings below may reference images not displayed]

FINDINGS: Lower chest: The lung bases are clear.

Hepatobiliary: Focal fatty infiltration adjacent with falciform
ligament. No focal hepatic lesion. The gallbladder is contracted, no
calcified gallstone.

Pancreas: Unremarkable. No pancreatic ductal dilatation or
surrounding inflammatory changes.

Spleen: Normal in size without focal abnormality.

Adrenals/Urinary Tract: The adrenal glands are normal. Right kidney
a stock can malrotated directed anterior/laterally. No
hydronephrosis. No perinephric edema. Urinary bladder is
physiologically distended.

Stomach/Bowel: Lower abdominopelvic inflammation with induration of
intra-abdominal and pelvic fat. Pelvic large and small bowel loops
are fluid-filled and mildly edematous, which is felt to be secondary
to the pelvic inflammation, source favored tubo-ovarian abscess. The
appendix, normal or abnormal is not discretely identified.

Vascular/Lymphatic: Multiple lower retroperitoneal and pelvic lymph
nodes are likely reactive. No acute vascular abnormality.

Reproductive: There are tubular serpiginous thick-walled enhancing
fluid collections in both adnexa. Exact measurements are difficult
due to the irregular shape and nature, however on the left measure
at least 8.5 x 4.1 cm, and on the right 6.5 x 4.4 cm. There is no
definite internal air within these fluid collections. There is
diffuse induration of the pelvic and lower abdominal fat which
appears edematous. There is prominent uterine and adnexal
vascularity diffusely. Small amount of free fluid in the pelvis.

Other: No free air.

Musculoskeletal: Bilateral L5 pars interarticularis defects without
listhesis. No acute osseous abnormality.
IMPRESSION: 1. Pelvic inflammation with bilateral adnexal thick walled
serpiginous fluid collections, most consistent with tubo-ovarian
abscesses. There is associated induration of the lower abdominal and
pelvic fat and small amount of free fluid. Pelvic bowel loops appear
mildly edematous, felt to be secondary to the inflammatory process.
2. Incidental note of ptotic malrotated right kidney.
3. Incidental note of bilateral L5 pars interarticularis defects, no
listhesis.

## 2018-06-09 ENCOUNTER — Encounter (HOSPITAL_COMMUNITY): Payer: Self-pay | Admitting: Family Medicine

## 2018-06-09 ENCOUNTER — Ambulatory Visit (HOSPITAL_COMMUNITY)
Admission: EM | Admit: 2018-06-09 | Discharge: 2018-06-09 | Disposition: A | Discharge status: Home / Self Care | Payer: Self-pay | Attending: Family Medicine | Admitting: Family Medicine

## 2018-06-09 DIAGNOSIS — Z202 Contact with and (suspected) exposure to infections with a predominantly sexual mode of transmission: Secondary | ICD-10-CM

## 2018-06-09 DIAGNOSIS — Z881 Allergy status to other antibiotic agents status: Secondary | ICD-10-CM | POA: Insufficient documentation

## 2018-06-09 DIAGNOSIS — Z3202 Encounter for pregnancy test, result negative: Secondary | ICD-10-CM

## 2018-06-09 DIAGNOSIS — F17210 Nicotine dependence, cigarettes, uncomplicated: Secondary | ICD-10-CM | POA: Insufficient documentation

## 2018-06-09 DIAGNOSIS — Z79899 Other long term (current) drug therapy: Secondary | ICD-10-CM | POA: Insufficient documentation

## 2018-06-09 DIAGNOSIS — Z113 Encounter for screening for infections with a predominantly sexual mode of transmission: Secondary | ICD-10-CM

## 2018-06-09 LAB — POCT PREGNANCY, URINE: PREG TEST UR: NEGATIVE

## 2018-06-09 NOTE — ED Provider Notes (Signed)
[facility]    CSN: [id number] Arrival date & time: 06/09/18  4132     History   Chief Complaint No chief complaint on file.   HPI Christine Edwards is a 22 y.o. female.   This is a 22 year old woman who comes in for STD testing.  She has no symptoms but just wants peace of mind.  She has a new partner who also has no symptoms and is here today for STD testing.     History reviewed. No pertinent past medical history.  Patient Active Problem List   Diagnosis Date Noted  . Chlamydia 08/14/2016  . TOA (tubo-ovarian abscess) 08/12/2016  . Gonorrhea 08/12/2016    History reviewed. No pertinent surgical history.  OB History    Gravida  0   Para  0   Term  0   Preterm  0   AB  0   Living  0     SAB  0   TAB  0   Ectopic  0   Multiple  0   Live Births  0            Home Medications    Prior to Admission medications   Medication Sig Start Date End Date Taking? Authorizing Provider  metroNIDAZOLE (FLAGYL) 500 MG tablet Take 1 tablet (500 mg total) by mouth 2 (two) times daily. 05/18/17   Elpidio Anis, PA-C    Family History No family history on file.  Social History Social History   Tobacco Use  . Smoking status: Current Every Day Smoker    Packs/day: 0.50    Types: Cigarettes  . Smokeless tobacco: Never Used  Substance Use Topics  . Alcohol use: Yes  . Drug use: No     Allergies   Food; Azithromycin; and Erythromycin   Review of Systems Review of Systems   Physical Exam Triage Vital Signs ED Triage Vitals [06/09/18 0943]  Enc Vitals Group     BP 125/87     Pulse Rate 82     Resp 16     Temp 98.2 F (36.8 C)     Temp Source Oral     SpO2 97 %     Weight      Height      Head Circumference      Peak Flow      Pain Score      Pain Loc      Pain Edu?      Excl. in GC?    No data found.  Updated Vital Signs BP 125/87 (BP Location: Left Arm)   Pulse 82   Temp 98.2 F (36.8 C) (Oral)   Resp 16   SpO2  97%   Physical Exam  Constitutional: She is oriented to person, place, and time. She appears well-developed and well-nourished.  HENT:  Right Ear: External ear normal.  Left Ear: External ear normal.  Eyes: Conjunctivae are normal.  Neck: Normal range of motion.  Pulmonary/Chest: Effort normal.  Musculoskeletal: Normal range of motion.  Neurological: She is alert and oriented to person, place, and time.  Skin: Skin is warm and dry.  Psychiatric: She has a normal mood and affect.  Nursing note and vitals reviewed.    UC Treatments / Results  Labs (all labs ordered are listed, but only abnormal results are displayed) Labs Reviewed  RPR  HIV ANTIBODY (ROUTINE TESTING W REFLEX)  URINE CYTOLOGY ANCILLARY ONLY    EKG None  Radiology No results  found.  Procedures Procedures (including critical care time)  Medications Ordered in UC Medications - No data to display  Initial Impression / Assessment and Plan / UC Course  I have reviewed the triage vital signs and the nursing notes.  Pertinent labs & imaging results that were available during my care of the patient were reviewed by me and considered in my medical decision making (see chart for details).    Final Clinical Impressions(s) / UC Diagnoses   Final diagnoses:  Exposure to sexually transmitted disease (STD)     Discharge Instructions     We are running the standard STD test panel.  Results should be completed by Monday although some results may be back sooner.  You can check online for the results using my chart and your 15 digit security password.    ED Prescriptions    None     Controlled Substance Prescriptions [place] Controlled Substance Registry consulted? Not Applicable   Elvina Sidle, MD 06/09/18 [phone]

## 2018-06-09 NOTE — ED Triage Notes (Signed)
Pt presents for STD Testing; pt states she is not displaying any symptoms.

## 2018-06-09 NOTE — Discharge Instructions (Addendum)
We are running the standard STD test panel.  Results should be completed by Monday although some results may be back sooner.  You can check online for the results using my chart and your 15 digit security password.

## 2018-06-10 LAB — HIV ANTIBODY (ROUTINE TESTING W REFLEX): HIV Screen 4th Generation wRfx: NONREACTIVE

## 2018-06-10 LAB — RPR: RPR Ser Ql: NONREACTIVE

## 2018-06-12 LAB — URINE CYTOLOGY ANCILLARY ONLY
Chlamydia: NEGATIVE
Neisseria Gonorrhea: POSITIVE — AB
Trichomonas: NEGATIVE

## 2018-06-13 ENCOUNTER — Telehealth: Payer: Self-pay | Admitting: Emergency Medicine

## 2018-06-13 NOTE — Telephone Encounter (Signed)
Called patient, no answer, left generic message to return call. Gonorrhea is positive.  Patient should return as soon as possible to the urgent care for treatment with IM rocephin 250mg  and po zithromax 1g. Patient will not need to see a provider unless there are new symptoms she would like evaluated. Pt needs education to refrain from sexual intercourse for now and for 7 days after treatment to give the medicine time to work. Sexual partners need to be notified and tested/treated. Condoms may reduce risk of reinfection. [facility] notified.

## 2018-06-13 NOTE — Telephone Encounter (Signed)
Pt advised as below. She will return to [facility] for treatment.

## 2018-06-22 ENCOUNTER — Ambulatory Visit (HOSPITAL_COMMUNITY)
Admission: EM | Admit: 2018-06-22 | Discharge: 2018-06-22 | Disposition: A | Discharge status: Home / Self Care | Payer: Self-pay | Attending: Family Medicine | Admitting: Family Medicine

## 2018-06-22 DIAGNOSIS — A549 Gonococcal infection, unspecified: Secondary | ICD-10-CM

## 2018-06-22 MED ORDER — CEFTRIAXONE SODIUM 250 MG IJ SOLR
250.0000 mg | Freq: Once | INTRAMUSCULAR | Status: AC
  Administered 2018-06-22: 250 mg via INTRAMUSCULAR

## 2018-06-22 MED ORDER — AZITHROMYCIN 250 MG PO TABS
1000.0000 mg | ORAL_TABLET | Freq: Once | ORAL | Status: AC
  Administered 2018-06-22: 1000 mg via ORAL

## 2018-06-22 MED ORDER — AZITHROMYCIN 250 MG PO TABS
ORAL_TABLET | ORAL | Status: AC
  Filled 2018-06-22: qty 4

## 2018-06-22 MED ORDER — CEFTRIAXONE SODIUM 250 MG IJ SOLR
INTRAMUSCULAR | Status: AC
  Filled 2018-06-22: qty 250

## 2018-10-21 ENCOUNTER — Encounter (HOSPITAL_COMMUNITY): Payer: Self-pay

## 2018-10-21 ENCOUNTER — Other Ambulatory Visit: Payer: Self-pay

## 2018-10-21 ENCOUNTER — Ambulatory Visit (HOSPITAL_COMMUNITY)
Admission: EM | Admit: 2018-10-21 | Discharge: 2018-10-21 | Disposition: A | Discharge status: Home / Self Care | Payer: Self-pay | Attending: Family Medicine | Admitting: Family Medicine

## 2018-10-21 DIAGNOSIS — Z113 Encounter for screening for infections with a predominantly sexual mode of transmission: Secondary | ICD-10-CM

## 2018-10-21 DIAGNOSIS — Z202 Contact with and (suspected) exposure to infections with a predominantly sexual mode of transmission: Secondary | ICD-10-CM | POA: Insufficient documentation

## 2018-10-21 DIAGNOSIS — N898 Other specified noninflammatory disorders of vagina: Secondary | ICD-10-CM | POA: Insufficient documentation

## 2018-10-21 MED ORDER — AZITHROMYCIN 250 MG PO TABS
1000.0000 mg | ORAL_TABLET | Freq: Once | ORAL | Status: AC
  Administered 2018-10-21: 1000 mg via ORAL

## 2018-10-21 MED ORDER — CEFTRIAXONE SODIUM 250 MG IJ SOLR
250.0000 mg | Freq: Once | INTRAMUSCULAR | Status: AC
  Administered 2018-10-21: 250 mg via INTRAMUSCULAR

## 2018-10-21 MED ORDER — AZITHROMYCIN 250 MG PO TABS
ORAL_TABLET | ORAL | Status: AC
  Filled 2018-10-21: qty 4

## 2018-10-21 MED ORDER — CEFTRIAXONE SODIUM 250 MG IJ SOLR
INTRAMUSCULAR | Status: AC
  Filled 2018-10-21: qty 250

## 2018-10-21 NOTE — Discharge Instructions (Addendum)

## 2018-10-21 NOTE — ED Triage Notes (Signed)
Pt cc she needed STD treatment.

## 2018-10-21 NOTE — ED Provider Notes (Signed)
[facility]   [id number] 10/21/18 Arrival Time: 1351  ASSESSMENT & PLAN:  1. Vaginal discharge   2. Possible exposure to STD      Discharge Instructions     You have been given the following medications today for treatment of suspected gonorrhea and/or chlamydia:  cefTRIAXone (ROCEPHIN) injection 250 mg azithromycin (ZITHROMAX) tablet 1,000 mg  Even though we have treated you today, we have sent testing for sexually transmitted infections. We will notify you of any positive results once they are received. If required, we will prescribe any medications you might need.  Please refrain from all sexual activity for at least the next seven days.    Pending: Labs Reviewed  CERVICOVAGINAL ANCILLARY ONLY   Will notify of any positive results. Instructed to refrain from sexual activity for at least seven days.  Reviewed expectations re: course of current medical issues. Questions answered. Outlined signs and symptoms indicating need for more acute intervention. Patient verbalized understanding. After Visit Summary given.   SUBJECTIVE:  Christine Edwards is a 23 y.o. female who presents with complaint of vaginal discharge. Onset abrupt. First noticed within the past 1-2 days. Describes discharge as thick and white/yellow. Urinary symptoms: none. Afebrile. No abdominal or pelvic pain. No n/v. No rashes or lesions. Sexually active with single female partner. Occasional condom use. History of STI: h/o treated gonorrhea 4 months ago.  Patient's last menstrual period was 10/13/2018.  ROS: As per HPI.  OBJECTIVE:  Vitals:   10/21/18 1412 10/21/18 1413  BP: 102/63   Pulse: 70   Resp: 16   Temp: 98 F (36.7 C)   TempSrc: Oral   SpO2: 100%   Weight:  61.2 kg     General appearance: alert, cooperative, appears stated age and no distress Throat: lips, mucosa, and tongue normal; teeth and gums normal CV: RRR Lungs: CTAB Back: no CVA tenderness; FROM at waist Abdomen:  soft, non-tender GU: deferred Skin: warm and dry Psychological: alert and cooperative; normal mood and affect.  Labs Reviewed: Results for orders placed or performed during the hospital encounter of 06/09/18  RPR  Result Value Ref Range   RPR Ser Ql Non Reactive Non Reactive  HIV antibody  Result Value Ref Range   HIV Screen 4th Generation wRfx Non Reactive Non Reactive  Pregnancy, urine POC  Result Value Ref Range   Preg Test, Ur NEGATIVE NEGATIVE  Urine cytology ancillary only  Result Value Ref Range   Chlamydia Negative    Neisseria gonorrhea **POSITIVE** (A)    Trichomonas Negative     Labs Reviewed  CERVICOVAGINAL ANCILLARY ONLY    Allergies  Allergen Reactions  . 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    . Azithromycin Other (See Comments)    Reaction:  Unknown   . Erythromycin Other (See Comments)    Reaction:  Unknown      Social History   Socioeconomic History  . Marital status: Single    Spouse name: Not on file  . Number of children: Not on file  . Years of education: Not on file  . Highest education level: Not on file  Occupational History  . Not on file  Social Needs  . Financial resource strain: Not on file  . Food insecurity:    Worry: Not on file    Inability: Not on file  . Transportation needs:    Medical: Not on file    Non-medical: Not on file  Tobacco Use  . Smoking status: Current Every Day Smoker    Packs/day: 0.50    Types: Cigarettes  . Smokeless tobacco: Never Used  Substance and Sexual Activity  . Alcohol use: Yes  . Drug use: No  . Sexual activity: Yes    Birth control/protection: Condom  Lifestyle  . Physical activity:    Days per week: Not on file    Minutes per session: Not on file  . Stress: Not on file  Relationships  . Social connections:    Talks on phone: Not on file    Gets together: Not on file    Attends religious service: Not on file    Active member of club or  organization: Not on file    Attends meetings of clubs or organizations: Not on file    Relationship status: Not on file  . Intimate partner violence:    Fear of current or ex partner: Not on file    Emotionally abused: Not on file    Physically abused: Not on file    Forced sexual activity: Not on file  Other Topics Concern  . Not on file  Social History Narrative  . Not on file          Mardella Layman, MD 10/21/18 [phone]

## 2018-10-24 LAB — CERVICOVAGINAL ANCILLARY ONLY
BACTERIAL VAGINITIS: POSITIVE — AB
Candida vaginitis: NEGATIVE
Chlamydia: POSITIVE — AB
NEISSERIA GONORRHEA: NEGATIVE
Trichomonas: NEGATIVE

## 2018-10-25 ENCOUNTER — Telehealth (HOSPITAL_COMMUNITY): Payer: Self-pay | Admitting: Emergency Medicine

## 2018-10-25 MED ORDER — METRONIDAZOLE 500 MG PO TABS: 500.0000 mg | ORAL_TABLET | Freq: Two times a day (BID) | ORAL | 0 refills | Status: AC

## 2018-10-25 NOTE — Telephone Encounter (Signed)
Bacterial vaginosis is positive. This was not treated at the urgent care visit.  Flagyl 500 mg BID x 7 days #14 no refills sent to patients pharmacy of choice.    Chlamydia is positive.  This was treated at the urgent care visit with po zithromax 1g.  Pt needs education to please refrain from sexual intercourse for 7 days to give the medicine time to work.  Sexual partners need to be notified and tested/treated.  Condoms may reduce risk of reinfection.  Recheck or followup with PCP for further evaluation if symptoms are not improving.  [facility] notified.

## 2020-09-06 ENCOUNTER — Emergency Department (HOSPITAL_COMMUNITY)
Admission: EM | Admit: 2020-09-06 | Discharge: 2020-09-06 | Disposition: A | Discharge status: Home / Self Care | Payer: Self-pay | Attending: Emergency Medicine | Admitting: Emergency Medicine

## 2020-09-06 DIAGNOSIS — S3141XA Laceration without foreign body of vagina and vulva, initial encounter: Secondary | ICD-10-CM | POA: Insufficient documentation

## 2020-09-06 DIAGNOSIS — F17210 Nicotine dependence, cigarettes, uncomplicated: Secondary | ICD-10-CM | POA: Insufficient documentation

## 2020-09-06 NOTE — ED Notes (Signed)
Pt discharged by this RN, refused this RN to help her to exit or to call a ride for her. PT provided with pants and non slip socks. Pt ambulated out of department independently.
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
## 2020-09-06 NOTE — ED Notes (Addendum)
This RN assisted Dr. Bernette Mayers with vaginal exam. Pt reports it hurts too much. Will not allow MD to fully evaluate. MD able to see superficial laceration. Pt refused sutures.

## 2021-02-25 ENCOUNTER — Encounter (HOSPITAL_COMMUNITY): Payer: Self-pay | Admitting: Emergency Medicine

## 2021-02-25 ENCOUNTER — Emergency Department (HOSPITAL_COMMUNITY)
Admission: EM | Admit: 2021-02-25 | Discharge: 2021-02-26 | Disposition: A | Discharge status: Home / Self Care | Payer: Self-pay | Attending: Emergency Medicine | Admitting: Emergency Medicine

## 2021-02-25 DIAGNOSIS — H5789 Other specified disorders of eye and adnexa: Secondary | ICD-10-CM | POA: Insufficient documentation

## 2021-02-25 DIAGNOSIS — Z5321 Procedure and treatment not carried out due to patient leaving prior to being seen by health care provider: Secondary | ICD-10-CM | POA: Insufficient documentation

## 2021-02-25 NOTE — ED Triage Notes (Signed)
Patient c/o L eye swelling and pain since waking this morning. Eye visibly swollen. Denies trauma.

## 2022-11-24 ENCOUNTER — Emergency Department (HOSPITAL_COMMUNITY): Payer: Self-pay

## 2022-11-24 ENCOUNTER — Other Ambulatory Visit: Payer: Self-pay

## 2022-11-24 ENCOUNTER — Encounter (HOSPITAL_COMMUNITY): Payer: Self-pay | Admitting: Emergency Medicine

## 2022-11-24 ENCOUNTER — Emergency Department (HOSPITAL_COMMUNITY)
Admission: EM | Admit: 2022-11-24 | Discharge: 2022-11-24 | Disposition: A | Discharge status: Home / Self Care | Payer: Self-pay | Attending: Emergency Medicine | Admitting: Emergency Medicine

## 2022-11-24 ENCOUNTER — Encounter (HOSPITAL_COMMUNITY): Payer: Self-pay

## 2022-11-24 DIAGNOSIS — X58XXXA Exposure to other specified factors, initial encounter: Secondary | ICD-10-CM | POA: Insufficient documentation

## 2022-11-24 DIAGNOSIS — R000 Tachycardia, unspecified: Secondary | ICD-10-CM | POA: Insufficient documentation

## 2022-11-24 DIAGNOSIS — T50901A Poisoning by unspecified drugs, medicaments and biological substances, accidental (unintentional), initial encounter: Secondary | ICD-10-CM | POA: Insufficient documentation

## 2022-11-24 LAB — CBG MONITORING, ED: Glucose-Capillary: 59 mg/dL — ABNORMAL LOW (ref 70–99)

## 2022-11-24 LAB — CBC WITH DIFFERENTIAL/PLATELET
Abs Immature Granulocytes: 0.23 10*3/uL — ABNORMAL HIGH (ref 0.00–0.07)
Basophils Absolute: 0.1 10*3/uL (ref 0.0–0.1)
Basophils Relative: 0 %
Eosinophils Absolute: 0 10*3/uL (ref 0.0–0.5)
Eosinophils Relative: 0 %
HCT: 38.8 % (ref 36.0–46.0)
Hemoglobin: 12.2 g/dL (ref 12.0–15.0)
Immature Granulocytes: 1 %
Lymphocytes Relative: 5 %
Lymphs Abs: 1.3 10*3/uL (ref 0.7–4.0)
MCH: 30.7 pg (ref 26.0–34.0)
MCHC: 31.4 g/dL (ref 30.0–36.0)
MCV: 97.7 fL (ref 80.0–100.0)
Monocytes Absolute: 0.7 10*3/uL (ref 0.1–1.0)
Monocytes Relative: 3 %
Neutro Abs: 21.2 10*3/uL — ABNORMAL HIGH (ref 1.7–7.7)
Neutrophils Relative %: 91 %
Platelets: 264 10*3/uL (ref 150–400)
RBC: 3.97 MIL/uL (ref 3.87–5.11)
RDW: 14.5 % (ref 11.5–15.5)
WBC: 23.6 10*3/uL — ABNORMAL HIGH (ref 4.0–10.5)
nRBC: 0 % (ref 0.0–0.2)

## 2022-11-24 LAB — COMPREHENSIVE METABOLIC PANEL
ALT: 47 U/L — ABNORMAL HIGH (ref 0–44)
AST: 58 U/L — ABNORMAL HIGH (ref 15–41)
Albumin: 4.4 g/dL (ref 3.5–5.0)
Alkaline Phosphatase: 63 U/L (ref 38–126)
Anion gap: 15 (ref 5–15)
BUN: 22 mg/dL — ABNORMAL HIGH (ref 6–20)
CO2: 22 mmol/L (ref 22–32)
Calcium: 8.5 mg/dL — ABNORMAL LOW (ref 8.9–10.3)
Chloride: 103 mmol/L (ref 98–111)
Creatinine, Ser: 1.14 mg/dL — ABNORMAL HIGH (ref 0.44–1.00)
GFR, Estimated: >60 mL/min (ref >60)
Glucose, Bld: 194 mg/dL — ABNORMAL HIGH (ref 70–99)
Potassium: 4.5 mmol/L (ref 3.5–5.1)
Sodium: 140 mmol/L (ref 135–145)
Total Bilirubin: 0.8 mg/dL (ref 0.3–1.2)
Total Protein: 7.4 g/dL (ref 6.5–8.1)

## 2022-11-24 LAB — ETHANOL: Alcohol, Ethyl (B): <10 mg/dL (ref <10)

## 2022-11-24 LAB — HCG, SERUM, QUALITATIVE: Preg, Serum: NEGATIVE

## 2022-11-24 LAB — SALICYLATE LEVEL: Salicylate Lvl: <7 mg/dL — ABNORMAL LOW (ref 7.0–30.0)

## 2022-11-24 LAB — ACETAMINOPHEN LEVEL: Acetaminophen (Tylenol), Serum: <10 ug/mL — ABNORMAL LOW (ref 10–30)

## 2022-11-24 MED ORDER — ONDANSETRON HCL 4 MG/2ML IJ SOLN
4.0000 mg | Freq: Once | INTRAMUSCULAR | Status: AC
  Administered 2022-11-24: 4 mg via INTRAVENOUS
  Filled 2022-11-24: qty 2

## 2022-11-24 MED ORDER — SODIUM CHLORIDE 0.9 % IV BOLUS: 1000.0000 mL | Freq: Once | INTRAVENOUS | Status: DC

## 2022-11-24 MED ORDER — SODIUM CHLORIDE 0.9 % IV BOLUS
1000.0000 mL | Freq: Once | INTRAVENOUS | Status: AC
  Administered 2022-11-24: 1000 mL via INTRAVENOUS
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
## 2022-11-24 NOTE — ED Provider Notes (Signed)
Patient seen after prior EDP.  Patient is alert and awake.  She is requesting breakfast.  She is ambulating in a safe manner.  Patient is appropriate for discharge.   Importance of close follow-up was stressed.  Strict return precautions given and understood.   Valarie Merino, MD 11/24/22 [phone]
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
## 2022-11-24 NOTE — Discharge Instructions (Signed)
Return for any problem.  ?

## 2022-11-24 NOTE — ED Notes (Signed)
Patient given breakfast and ambulated to the bathroom.

## 2022-11-24 NOTE — ED Notes (Signed)
Pt given a orange juice and sandwich. Pt is currently eating at this moment

## 2022-11-24 NOTE — ED Notes (Signed)
CBG 59. 

## 2022-11-24 NOTE — ED Notes (Signed)
Assumed care of patient. Patient resting comfortably in bed with no signs of acute distress noted. Waiting on dispo.
